# Patient Record
Sex: Female | Born: 1995 | Race: Black or African American | Hispanic: No | Marital: Single
Health system: Southern US, Community
[De-identification: ages and names within clinical notes are randomized; demographics above are authoritative.]

## PROBLEM LIST (undated history)

## (undated) DIAGNOSIS — J45909 Unspecified asthma, uncomplicated: Secondary | ICD-10-CM

---

## 2017-07-27 ENCOUNTER — Encounter (HOSPITAL_COMMUNITY): Payer: Self-pay | Admitting: Nurse Practitioner

## 2017-07-27 ENCOUNTER — Emergency Department (HOSPITAL_COMMUNITY)
Admission: EM | Admit: 2017-07-27 | Discharge: 2017-07-27 | Disposition: A | Discharge status: Home / Self Care | Payer: BLUE CROSS/BLUE SHIELD | Attending: Emergency Medicine | Admitting: Emergency Medicine

## 2017-07-27 ENCOUNTER — Emergency Department (HOSPITAL_COMMUNITY): Payer: BLUE CROSS/BLUE SHIELD

## 2017-07-27 ENCOUNTER — Other Ambulatory Visit: Payer: Self-pay

## 2017-07-27 DIAGNOSIS — J45909 Unspecified asthma, uncomplicated: Secondary | ICD-10-CM | POA: Insufficient documentation

## 2017-07-27 DIAGNOSIS — R102 Pelvic and perineal pain: Secondary | ICD-10-CM | POA: Insufficient documentation

## 2017-07-27 DIAGNOSIS — Z79899 Other long term (current) drug therapy: Secondary | ICD-10-CM | POA: Diagnosis not present

## 2017-07-27 DIAGNOSIS — N72 Inflammatory disease of cervix uteri: Secondary | ICD-10-CM | POA: Diagnosis not present

## 2017-07-27 DIAGNOSIS — R1030 Lower abdominal pain, unspecified: Secondary | ICD-10-CM | POA: Diagnosis present

## 2017-07-27 HISTORY — DX: Unspecified asthma, uncomplicated: J45.909

## 2017-07-27 LAB — LIPASE, BLOOD: LIPASE: 26 U/L (ref 11–51)

## 2017-07-27 LAB — I-STAT BETA HCG BLOOD, ED (MC, WL, AP ONLY): I-stat hCG, quantitative: <5 m[IU]/mL (ref <5)

## 2017-07-27 LAB — URINALYSIS, ROUTINE W REFLEX MICROSCOPIC
Bilirubin Urine: NEGATIVE
Glucose, UA: NEGATIVE mg/dL
Ketones, ur: NEGATIVE mg/dL
Nitrite: NEGATIVE
PH: 5 (ref 5.0–8.0)
Protein, ur: NEGATIVE mg/dL
SPECIFIC GRAVITY, URINE: 1.018 (ref 1.005–1.030)

## 2017-07-27 LAB — COMPREHENSIVE METABOLIC PANEL
ALT: 24 U/L (ref 14–54)
AST: 28 U/L (ref 15–41)
Albumin: 3.7 g/dL (ref 3.5–5.0)
Alkaline Phosphatase: 53 U/L (ref 38–126)
Anion gap: 11 (ref 5–15)
BUN: 11 mg/dL (ref 6–20)
CO2: 24 mmol/L (ref 22–32)
CREATININE: 0.8 mg/dL (ref 0.44–1.00)
Calcium: 9.5 mg/dL (ref 8.9–10.3)
Chloride: 103 mmol/L (ref 101–111)
GFR calc Af Amer: >60 mL/min (ref >60)
Glucose, Bld: 124 mg/dL — ABNORMAL HIGH (ref 65–99)
Potassium: 3.6 mmol/L (ref 3.5–5.1)
Sodium: 138 mmol/L (ref 135–145)
Total Bilirubin: 0.8 mg/dL (ref 0.3–1.2)
Total Protein: 8.3 g/dL — ABNORMAL HIGH (ref 6.5–8.1)

## 2017-07-27 LAB — CBC
HCT: 37.6 % (ref 36.0–46.0)
Hemoglobin: 12.3 g/dL (ref 12.0–15.0)
MCH: 28.1 pg (ref 26.0–34.0)
MCHC: 32.7 g/dL (ref 30.0–36.0)
MCV: 86 fL (ref 78.0–100.0)
PLATELETS: 290 10*3/uL (ref 150–400)
RBC: 4.37 MIL/uL (ref 3.87–5.11)
RDW: 13.7 % (ref 11.5–15.5)
WBC: 17.5 10*3/uL — AB (ref 4.0–10.5)

## 2017-07-27 LAB — WET PREP, GENITAL
CLUE CELLS WET PREP: NONE SEEN
Sperm: NONE SEEN
Trich, Wet Prep: NONE SEEN
YEAST WET PREP: NONE SEEN

## 2017-07-27 MED ORDER — IBUPROFEN 800 MG PO TABS
800.0000 mg | ORAL_TABLET | Freq: Once | ORAL | Status: AC
  Administered 2017-07-27: 800 mg via ORAL
  Filled 2017-07-27: qty 1

## 2017-07-27 MED ORDER — MORPHINE SULFATE (PF) 4 MG/ML IV SOLN
4.0000 mg | Freq: Once | INTRAVENOUS | Status: AC
  Administered 2017-07-27: 4 mg via INTRAVENOUS
  Filled 2017-07-27: qty 1

## 2017-07-27 MED ORDER — DOXYCYCLINE HYCLATE 100 MG PO CAPS: 100.0000 mg | ORAL_CAPSULE | Freq: Two times a day (BID) | ORAL | 0 refills | Status: AC

## 2017-07-27 MED ORDER — SODIUM CHLORIDE 0.9 % IJ SOLN
INTRAMUSCULAR | Status: DC
Start: 2017-07-27 — End: 2017-07-28
  Filled 2017-07-27: qty 50

## 2017-07-27 MED ORDER — ONDANSETRON HCL 4 MG/2ML IJ SOLN
4.0000 mg | Freq: Once | INTRAMUSCULAR | Status: AC
  Administered 2017-07-27: 4 mg via INTRAVENOUS
  Filled 2017-07-27: qty 2

## 2017-07-27 MED ORDER — AZITHROMYCIN 250 MG PO TABS
1000.0000 mg | ORAL_TABLET | Freq: Once | ORAL | Status: AC
  Administered 2017-07-27: 1000 mg via ORAL
  Filled 2017-07-27: qty 4

## 2017-07-27 MED ORDER — SODIUM CHLORIDE 0.9 % IV BOLUS
1000.0000 mL | Freq: Once | INTRAVENOUS | Status: AC
  Administered 2017-07-27: 1000 mL via INTRAVENOUS

## 2017-07-27 MED ORDER — ACETAMINOPHEN 500 MG PO TABS: 1000.0000 mg | ORAL_TABLET | Freq: Once | ORAL | Status: DC

## 2017-07-27 MED ORDER — IOPAMIDOL (ISOVUE-300) INJECTION 61%
INTRAVENOUS | Status: DC
Start: 2017-07-27 — End: 2017-07-28
  Filled 2017-07-27: qty 100

## 2017-07-27 MED ORDER — SODIUM CHLORIDE 0.9 % IV SOLN
1.0000 g | Freq: Once | INTRAVENOUS | Status: AC
  Administered 2017-07-27: 1 g via INTRAVENOUS
  Filled 2017-07-27: qty 10

## 2017-07-27 MED ORDER — IOPAMIDOL (ISOVUE-300) INJECTION 61%
100.0000 mL | Freq: Once | INTRAVENOUS | Status: AC | PRN
  Administered 2017-07-27: 100 mL via INTRAVENOUS

## 2017-07-27 NOTE — Discharge Instructions (Signed)
Follow-up with your PCP.

## 2017-07-27 NOTE — ED Triage Notes (Signed)
Per EMS-coming from school-states LLQ pain that started around 1100-thinks she might be constipated-last BM was at 3 pm-states it was very hard

## 2017-07-27 NOTE — ED Provider Notes (Signed)
[facility]-EMERGENCY DEPT Provider Note   CSN: [id number] Arrival date & time: 07/27/17  1700     History   Chief Complaint Chief Complaint  Patient presents with  . Abdominal Pain    HPI Christina Galloway is a 22 y.o. female.  22 yo F with a chief complaint of abdominal pain.  This started periumbilical and now has moved lower in her abdomen.  She denies nausea or vomiting.  Has had some diffuse body aches.  No noted constipation or diarrhea.  No known sick contacts.  Some pain with urination.  Denies flank tenderness.  The history is provided by the patient.  Abdominal Pain   This is a new problem. The current episode started yesterday. The problem occurs constantly. The problem has not changed since onset.The pain is located in the suprapubic region. The pain is at a severity of 8/10. The pain is moderate. Pertinent negatives include fever, nausea, vomiting, dysuria, headaches, arthralgias and myalgias. Nothing aggravates the symptoms. Nothing relieves the symptoms.    Past Medical History:  Diagnosis Date  . Asthma     There are no active problems to display for this patient.   History reviewed. No pertinent surgical history.   OB History   None      Home Medications    Prior to Admission medications   Medication Sig Start Date End Date Taking? Authorizing Provider  albuterol (VENTOLIN HFA) 108 (90 Base) MCG/ACT inhaler Inhale 2 puffs into the lungs every 4 (four) hours as needed for shortness of breath. 04/27/17  Yes [provider]  cetirizine (ZYRTEC) 10 MG tablet Take 10 mg by mouth daily.   Yes [provider]  ferrous sulfate 325 (65 FE) MG EC tablet Take 325 mg by mouth 3 (three) times daily with meals.   Yes [provider]  JUNEL FE 24 1-20 MG-MCG(24) tablet Take 1 tablet by mouth daily. 07/26/17  Yes [provider]  doxycycline (VIBRAMYCIN) 100 MG capsule Take 1 capsule (100 mg total) by mouth 2 (two) times  daily. One po bid x 7 days 07/27/17   Melene Plan, DO    Family History History reviewed. No pertinent family history.  Social History Social History   Tobacco Use  . Smoking status: Unknown If Ever Smoked  Substance Use Topics  . Alcohol use: Not Currently  . Drug use: Not Currently     Allergies   Pecan extract allergy skin test   Review of Systems Review of Systems  Constitutional: Negative for chills and fever.  HENT: Negative for congestion and rhinorrhea.   Eyes: Negative for redness and visual disturbance.  Respiratory: Negative for shortness of breath and wheezing.   Cardiovascular: Negative for chest pain and palpitations.  Gastrointestinal: Positive for abdominal pain. Negative for nausea and vomiting.  Genitourinary: Negative for dysuria and urgency.  Musculoskeletal: Negative for arthralgias and myalgias.  Skin: Negative for pallor and wound.  Neurological: Negative for dizziness and headaches.     Physical Exam Updated Vital Signs BP (!) 114/58 (BP Location: Right Arm)   Pulse (!) 106   Temp (!) 100.4 F (38 C) (Oral)   Resp 18   Ht 5\' 4"  (1.626 m)   Wt 96.6 kg (213 lb)   LMP 07/27/2017   SpO2 97%   BMI 36.56 kg/m   Physical Exam  Constitutional: She is oriented to person, place, and time. She appears well-developed and well-nourished. No distress.  HENT:  Head: Normocephalic and atraumatic.  Eyes: Pupils are equal, round, and reactive to light. EOM are normal.  Neck: Normal range of motion. Neck supple.  Cardiovascular: Normal rate and regular rhythm. Exam reveals no gallop and no friction rub.  No murmur heard. Pulmonary/Chest: Effort normal. She has no wheezes. She has no rales.  Abdominal: Soft. She exhibits no distension. There is tenderness ( Worse to the suprapubic region).  Genitourinary:    Musculoskeletal: She exhibits no edema or tenderness.  Neurological: She is alert and oriented to person, place, and time.  Skin: Skin is warm  and dry. She is not diaphoretic.  Psychiatric: She has a normal mood and affect. Her behavior is normal.  Nursing note and vitals reviewed.    ED Treatments / Results  Labs (all labs ordered are listed, but only abnormal results are displayed) Labs Reviewed  WET PREP, GENITAL - Abnormal; Notable for the following components:      Result Value   WBC, Wet Prep HPF POC MODERATE (*)    All other components within normal limits  COMPREHENSIVE METABOLIC PANEL - Abnormal; Notable for the following components:   Glucose, Bld 124 (*)    Total Protein 8.3 (*)    All other components within normal limits  CBC - Abnormal; Notable for the following components:   WBC 17.5 (*)    All other components within normal limits  URINALYSIS, ROUTINE W REFLEX MICROSCOPIC - Abnormal; Notable for the following components:   APPearance HAZY (*)    Hgb urine dipstick LARGE (*)    Leukocytes, UA MODERATE (*)    Bacteria, UA RARE (*)    Squamous Epithelial / LPF 0-5 (*)    All other components within normal limits  LIPASE, BLOOD  I-STAT BETA HCG BLOOD, ED ([facility], [facility], [facility] ONLY)  GC/CHLAMYDIA PROBE AMP ([place]) NOT AT [facility]    EKG None  Radiology Ct Abdomen Pelvis W Contrast  Result Date: 07/27/2017 CLINICAL DATA:  Left lower quadrant abdominal pain. EXAM: CT ABDOMEN AND PELVIS WITH CONTRAST TECHNIQUE: Multidetector CT imaging of the abdomen and pelvis was performed using the standard protocol following bolus administration of intravenous contrast. CONTRAST:  100mL ISOVUE-300 IOPAMIDOL (ISOVUE-300) INJECTION 61% COMPARISON:  None. FINDINGS: Lower chest: No acute abnormality. Hepatobiliary: No focal liver abnormality is seen. No gallstones, gallbladder wall thickening, or biliary dilatation. Pancreas: Unremarkable. No pancreatic ductal dilatation or surrounding inflammatory changes. Spleen: Normal in size without focal abnormality. Adrenals/Urinary Tract: Adrenal glands are unremarkable. Kidneys are normal,  without renal calculi, focal lesion, or hydronephrosis. Bladder is unremarkable. Stomach/Bowel: Stomach is within normal limits. Appendix appears normal. No evidence of bowel wall thickening, distention, or inflammatory changes. Vascular/Lymphatic: No significant vascular findings are present. No enlarged abdominal or pelvic lymph nodes. Reproductive: Uterus and bilateral adnexa are unremarkable. Other: Trace free fluid in the pelvis is likely physiologic. No pneumoperitoneum. Musculoskeletal: No acute or significant osseous findings. 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 IMPRESSION: 1. No acute intra-abdominal process. No explanation for the patient's symptoms. Electronically Signed   By: Obie DredgeWilliam T Derry M.D.   On: 07/27/2017 22:22    Procedures Procedures (including critical care time)  Medications Ordered in ED Medications  iopamidol (ISOVUE-300) 61 % injection (has no administration in time range)  sodium chloride 0.9 % injection (has no administration in time range)  sodium chloride 0.9 % bolus 1,000 mL (0 mLs Intravenous Stopped 07/27/17 2100)  ondansetron (ZOFRAN) injection 4 mg (4 mg Intravenous Given 07/27/17 2013)  morphine 4 MG/ML  injection 4 mg (4 mg Intravenous Given 07/27/17 2013)  ibuprofen (ADVIL,MOTRIN) tablet 800 mg (800 mg Oral Given 07/27/17 2040)  cefTRIAXone (ROCEPHIN) 1 g in sodium chloride 0.9 % 100 mL IVPB (0 g Intravenous Stopped 07/27/17 2116)  azithromycin (ZITHROMAX) tablet 1,000 mg (1,000 mg Oral Given 07/27/17 2040)  iopamidol (ISOVUE-300) 61 % injection 100 mL (100 mLs Intravenous Contrast Given 07/27/17 2154)     Initial Impression / Assessment and Plan / ED Course  I have reviewed the triage vital signs and the nursing notes.  Pertinent labs & imaging results that were available during my care of the patient were reviewed by me and considered in my medical decision making (see chart for details).     22 yo F with a chief complaint of  lower abdominal pain.  Going on just for the past 12 hours.  On my exam the patient has tenderness worse in the suprapubic region.  Pelvic exam is concerning for cervical motion tenderness and right adnexal tenderness.  With the patient having a fever of 103 obtain a CT scan to evaluate for pelvic abscess.  CT negative. .  10:58 PM:  I have discussed the diagnosis/risks/treatment options with the patient and family and believe the pt to be eligible for discharge home to follow-up with PCP. We also discussed returning to the ED immediately if new or worsening sx occur. We discussed the sx which are most concerning (e.g., sudden worsening pain, fever, inability to tolerate by mouth) that necessitate immediate return. Medications administered to the patient during their visit and any new prescriptions provided to the patient are listed below.  Medications given during this visit Medications  iopamidol (ISOVUE-300) 61 % injection (has no administration in time range)  sodium chloride 0.9 % injection (has no administration in time range)  sodium chloride 0.9 % bolus 1,000 mL (0 mLs Intravenous Stopped 07/27/17 2100)  ondansetron (ZOFRAN) injection 4 mg (4 mg Intravenous Given 07/27/17 2013)  morphine 4 MG/ML injection 4 mg (4 mg Intravenous Given 07/27/17 2013)  ibuprofen (ADVIL,MOTRIN) tablet 800 mg (800 mg Oral Given 07/27/17 2040)  cefTRIAXone (ROCEPHIN) 1 g in sodium chloride 0.9 % 100 mL IVPB (0 g Intravenous Stopped 07/27/17 2116)  azithromycin (ZITHROMAX) tablet 1,000 mg (1,000 mg Oral Given 07/27/17 2040)  iopamidol (ISOVUE-300) 61 % injection 100 mL (100 mLs Intravenous Contrast Given 07/27/17 2154)     The patient appears reasonably screen and/or stabilized for discharge and I doubt any other medical condition or other [facility] requiring further screening, evaluation, or treatment in the ED at this time prior to discharge.    Final Clinical Impressions(s) / ED Diagnoses   Final diagnoses:  Cervicitis     ED Discharge Orders        Ordered    doxycycline (VIBRAMYCIN) 100 MG capsule  2 times daily     07/27/17 2234       Melene Plan, DO 07/27/17 2258

## 2017-07-28 LAB — GC/CHLAMYDIA PROBE AMP (~~LOC~~) NOT AT ARMC
CHLAMYDIA, DNA PROBE: POSITIVE — AB
Neisseria Gonorrhea: NEGATIVE

## 2019-10-08 IMAGING — CT CT ABD-PELV W/ CM
2 of 4 series · 17 of 46 positions shown, 19 images · IV contrast (ISOVUE)
Comparison: None.

CLINICAL DATA: Left lower quadrant abdominal pain.

EXAM:
CT ABDOMEN AND PELVIS WITH CONTRAST
TECHNIQUE: Multidetector CT imaging of the abdomen and pelvis was performed
using the standard protocol following bolus administration of
intravenous contrast.
CONTRAST:  100mL L523OC-YMM IOPAMIDOL (L523OC-YMM) INJECTION 61%

[Series 2: axial st · axial · 0.74mm/px · z∈[+1203,+1603]mm · 14 of 92 slices shown, 16 images]
[im 6/92  soft-tissue]
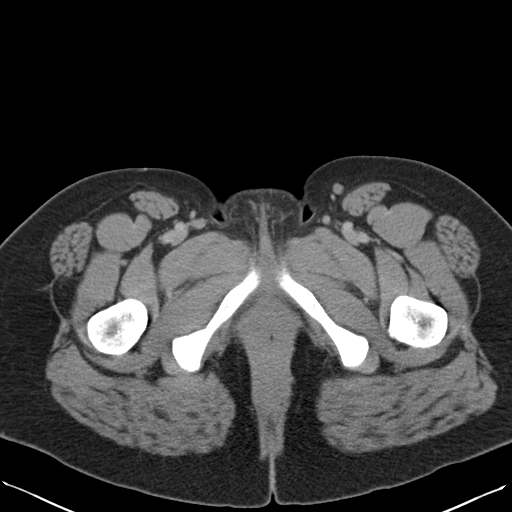
[im 6/92  bone]
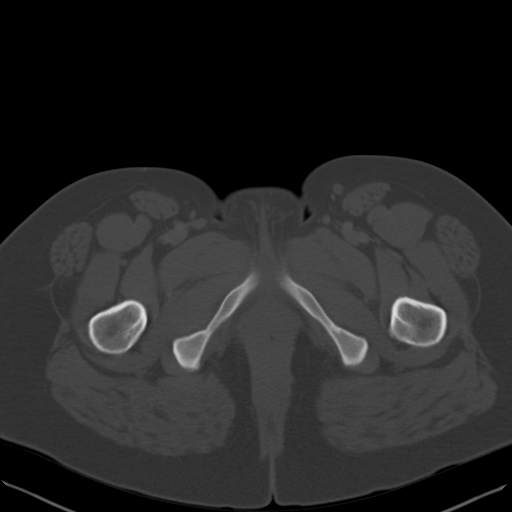
[im 11/92  soft-tissue]
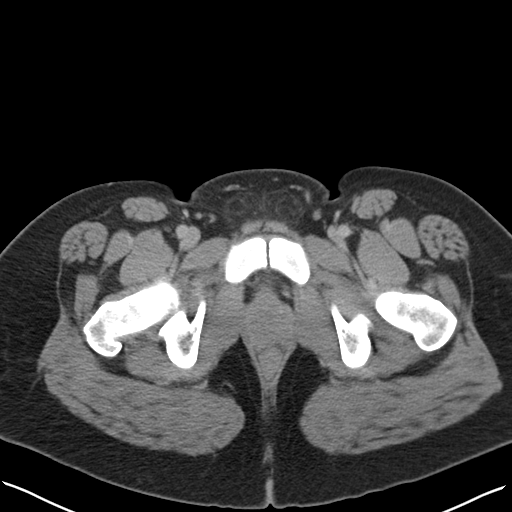
[im 21/92  soft-tissue]
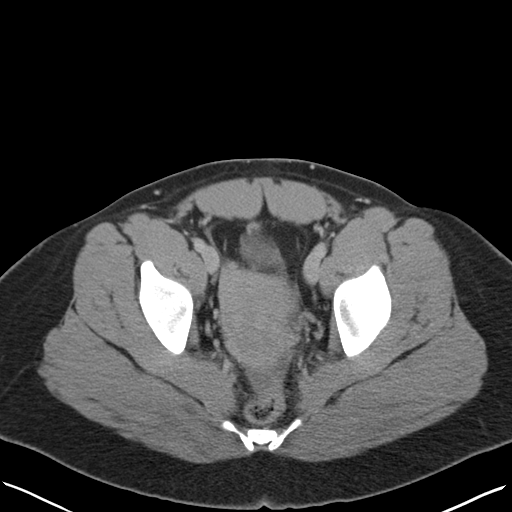
[im 26/92  soft-tissue]
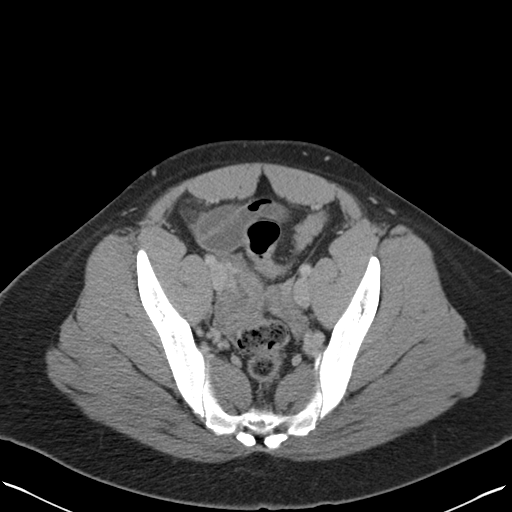
[im 31/92  soft-tissue]
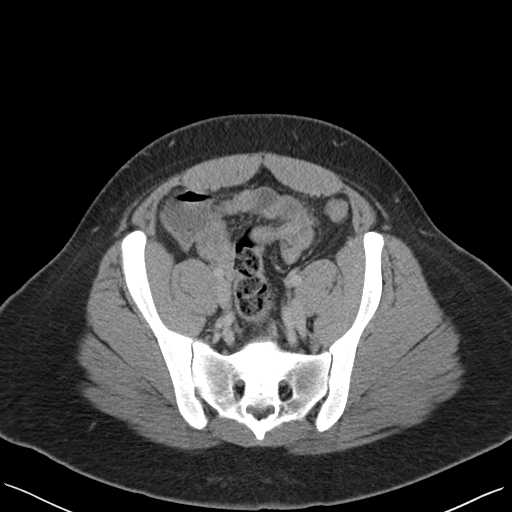
[im 36/92  soft-tissue]
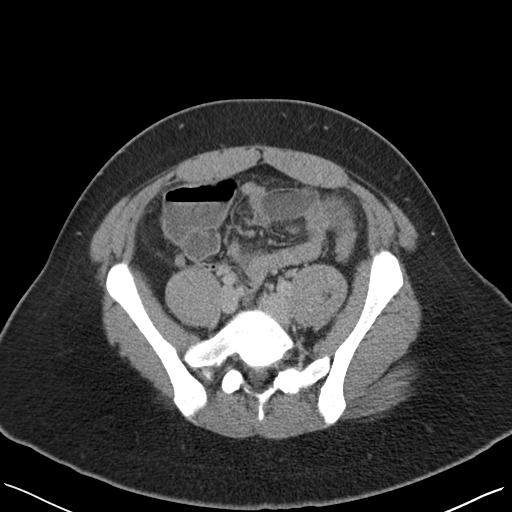
[im 41/92  soft-tissue]
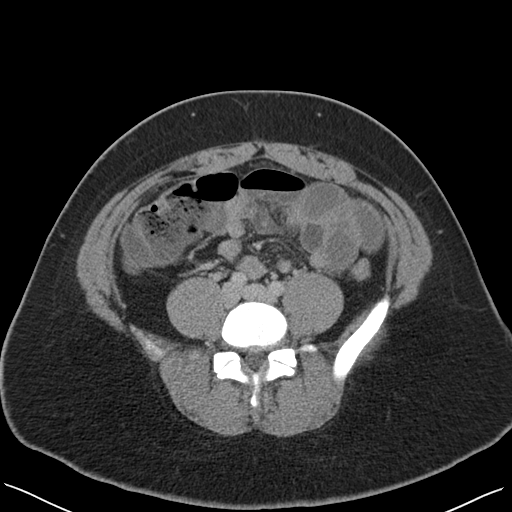
[im 51/92  soft-tissue]
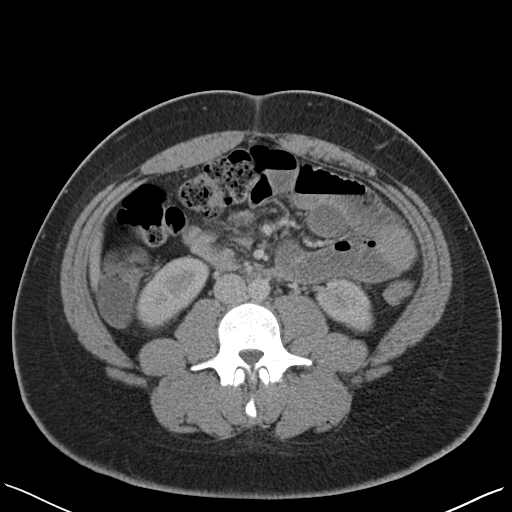
[im 56/92  soft-tissue]
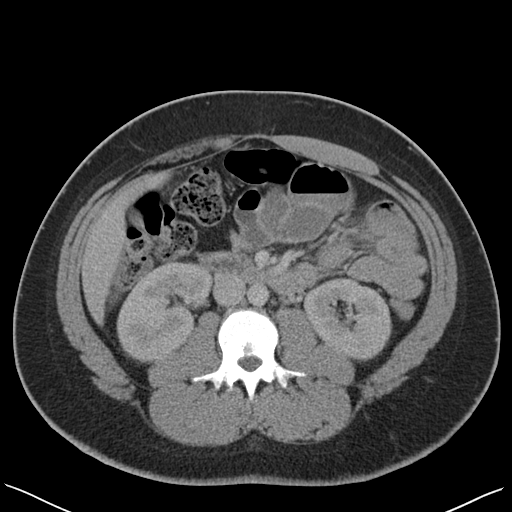
[im 56/92  bone]
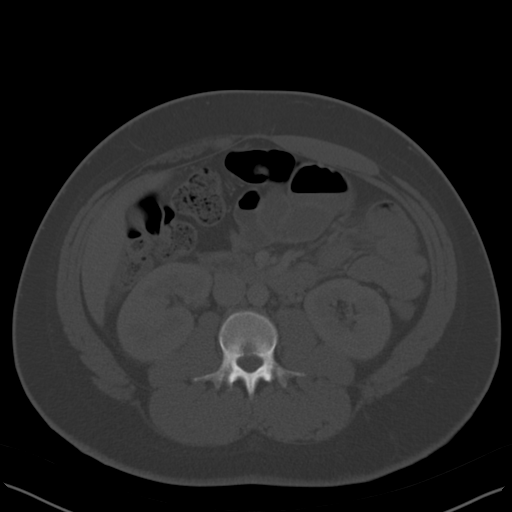
[im 61/92  soft-tissue]
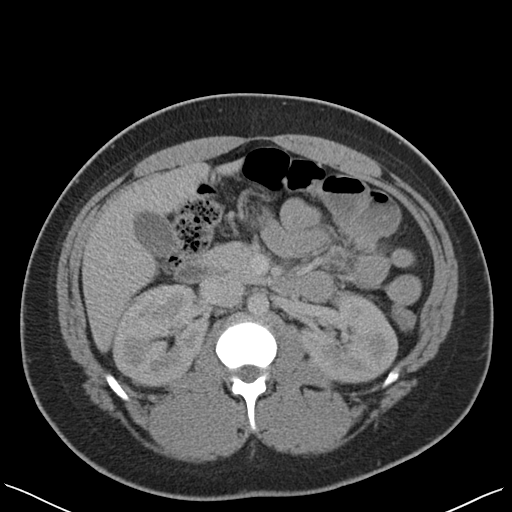
[im 66/92  soft-tissue]
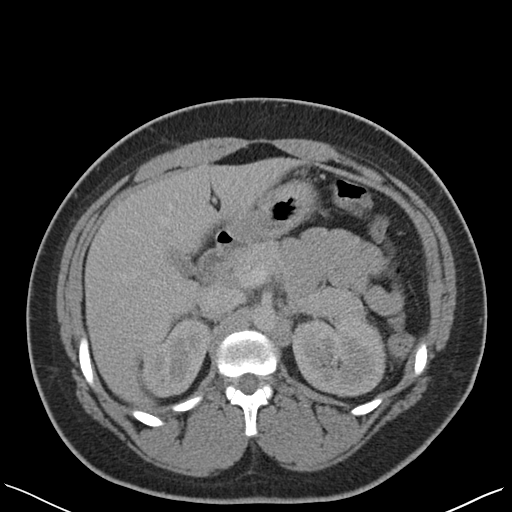
[im 71/92  soft-tissue]
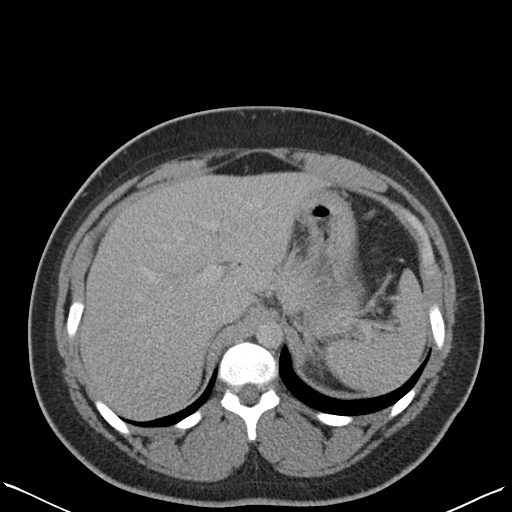
[im 81/92  soft-tissue]
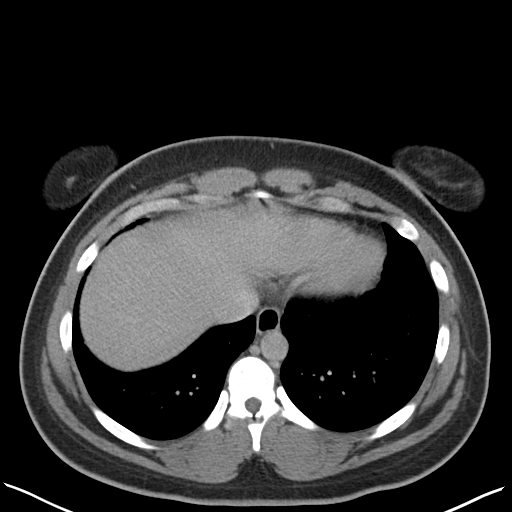
[im 86/92  soft-tissue]
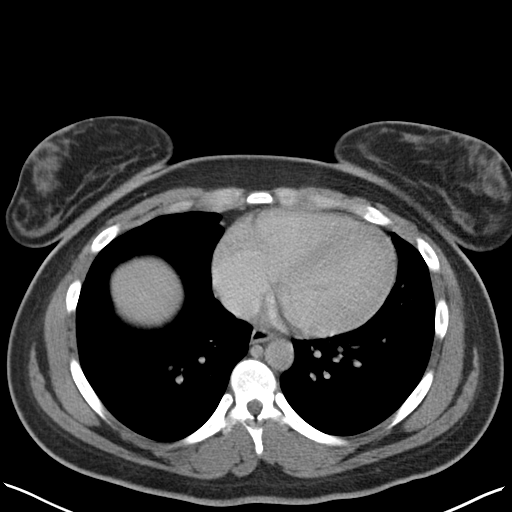

[Series 5: coronal st · coronal · 0.80mm/px · 3 of 107 slices shown]
[im 36/107  soft-tissue]
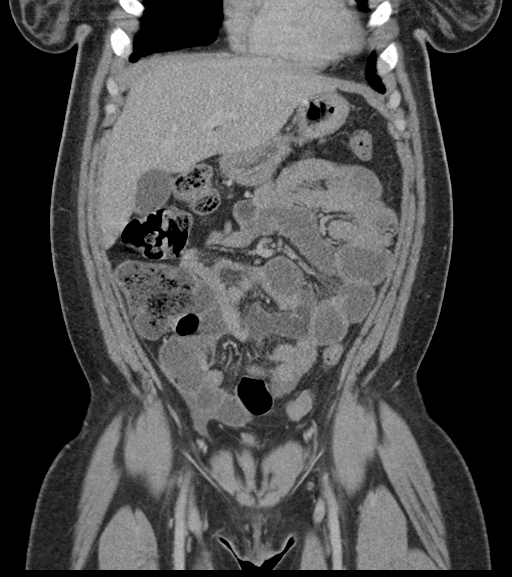
[im 48/107  soft-tissue]
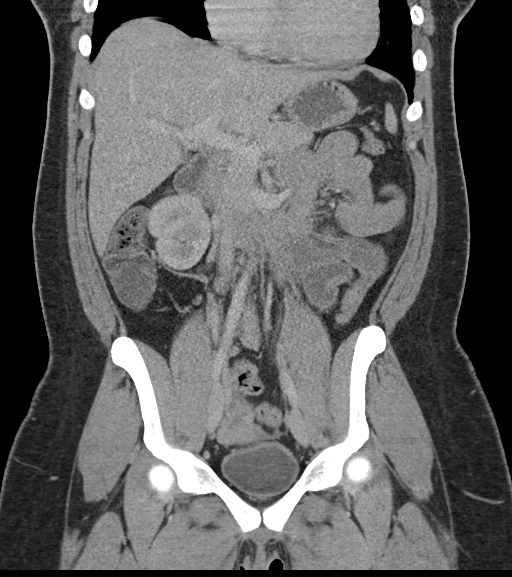
[im 59/107  soft-tissue]
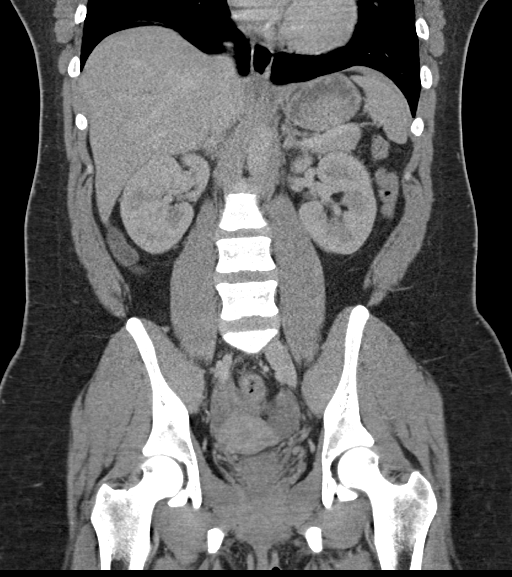

[17 of 46 positions shown; findings below may reference images not displayed]

FINDINGS: Lower chest: No acute abnormality.

Hepatobiliary: No focal liver abnormality is seen. No gallstones,
gallbladder wall thickening, or biliary dilatation.

Pancreas: Unremarkable. No pancreatic ductal dilatation or
surrounding inflammatory changes.

Spleen: Normal in size without focal abnormality.

Adrenals/Urinary Tract: Adrenal glands are unremarkable. Kidneys are
normal, without renal calculi, focal lesion, or hydronephrosis.
Bladder is unremarkable.

Stomach/Bowel: Stomach is within normal limits. Appendix appears
normal. No evidence of bowel wall thickening, distention, or
inflammatory changes.

Vascular/Lymphatic: No significant vascular findings are present. No
enlarged abdominal or pelvic lymph nodes.

Reproductive: Uterus and bilateral adnexa are unremarkable.

Other: Trace free fluid in the pelvis is likely physiologic. No
pneumoperitoneum.

Musculoskeletal: No acute or significant osseous findings.
Transitional lumbosacral anatomy with sacralization of L5 on the
right.
IMPRESSION: 1. No acute intra-abdominal process. No explanation for the
patient's symptoms.
